# Patient Record
Sex: Female | Born: 1965 | Race: White | Hispanic: No | Marital: Married | Smoking: Former smoker
Health system: Southern US, Community
[De-identification: ages and names within clinical notes are randomized; demographics above are authoritative.]

## PROBLEM LIST (undated history)

## (undated) DIAGNOSIS — F419 Anxiety disorder, unspecified: Secondary | ICD-10-CM

## (undated) DIAGNOSIS — C801 Malignant (primary) neoplasm, unspecified: Secondary | ICD-10-CM

## (undated) DIAGNOSIS — F32A Depression, unspecified: Secondary | ICD-10-CM

## (undated) DIAGNOSIS — R51 Headache: Secondary | ICD-10-CM

## (undated) DIAGNOSIS — R519 Headache, unspecified: Secondary | ICD-10-CM

## (undated) DIAGNOSIS — F329 Major depressive disorder, single episode, unspecified: Secondary | ICD-10-CM

## (undated) DIAGNOSIS — M1990 Unspecified osteoarthritis, unspecified site: Secondary | ICD-10-CM

## (undated) HISTORY — PX: ABDOMINAL HYSTERECTOMY: SHX81

---

## 1989-12-22 HISTORY — PX: TUBAL LIGATION: SHX77

## 2001-12-22 DIAGNOSIS — C801 Malignant (primary) neoplasm, unspecified: Secondary | ICD-10-CM

## 2001-12-22 HISTORY — PX: LYMPHADENECTOMY: SHX15

## 2001-12-22 HISTORY — DX: Malignant (primary) neoplasm, unspecified: C80.1

## 2005-10-31 ENCOUNTER — Ambulatory Visit: Payer: Self-pay | Admitting: Endocrinology

## 2006-09-03 ENCOUNTER — Ambulatory Visit: Payer: Self-pay | Admitting: Obstetrics and Gynecology

## 2007-01-18 ENCOUNTER — Ambulatory Visit: Payer: Self-pay | Admitting: Specialist

## 2009-03-02 ENCOUNTER — Ambulatory Visit: Payer: Self-pay | Admitting: Obstetrics and Gynecology

## 2009-03-14 ENCOUNTER — Ambulatory Visit: Payer: Self-pay | Admitting: Family Medicine

## 2009-07-27 ENCOUNTER — Ambulatory Visit: Payer: Self-pay | Admitting: General Practice

## 2010-03-06 ENCOUNTER — Ambulatory Visit: Payer: Self-pay | Admitting: Family Medicine

## 2010-10-18 ENCOUNTER — Ambulatory Visit: Payer: Self-pay | Admitting: Family Medicine

## 2011-03-18 ENCOUNTER — Ambulatory Visit: Payer: Self-pay | Admitting: Family Medicine

## 2012-04-01 ENCOUNTER — Ambulatory Visit: Payer: Self-pay | Admitting: Family Medicine

## 2012-04-12 ENCOUNTER — Ambulatory Visit: Payer: Self-pay | Admitting: Family Medicine

## 2013-04-05 ENCOUNTER — Ambulatory Visit: Payer: Self-pay | Admitting: Obstetrics and Gynecology

## 2014-05-18 ENCOUNTER — Emergency Department: Payer: Self-pay | Admitting: Emergency Medicine

## 2015-04-11 ENCOUNTER — Ambulatory Visit
Admit: 2015-04-11 | Disposition: A | Discharge status: Home / Self Care | Payer: Self-pay | Attending: Obstetrics and Gynecology | Admitting: Obstetrics and Gynecology

## 2015-10-24 ENCOUNTER — Other Ambulatory Visit: Payer: Self-pay | Admitting: Orthopedic Surgery

## 2015-10-24 DIAGNOSIS — M25512 Pain in left shoulder: Secondary | ICD-10-CM

## 2015-11-06 ENCOUNTER — Ambulatory Visit: Admission: RE | Admit: 2015-11-06 | Payer: BLUE CROSS/BLUE SHIELD | Source: Ambulatory Visit

## 2015-11-07 ENCOUNTER — Other Ambulatory Visit (HOSPITAL_COMMUNITY): Payer: Self-pay | Admitting: Orthopedic Surgery

## 2015-11-07 DIAGNOSIS — M25512 Pain in left shoulder: Secondary | ICD-10-CM

## 2015-11-19 ENCOUNTER — Ambulatory Visit (HOSPITAL_COMMUNITY): Admission: RE | Admit: 2015-11-19 | Payer: BLUE CROSS/BLUE SHIELD | Source: Ambulatory Visit

## 2015-11-22 ENCOUNTER — Ambulatory Visit
Admission: RE | Admit: 2015-11-22 | Discharge: 2015-11-22 | Disposition: A | Discharge status: Home / Self Care | Payer: BLUE CROSS/BLUE SHIELD | Source: Ambulatory Visit | Attending: Orthopedic Surgery | Admitting: Orthopedic Surgery

## 2015-11-22 DIAGNOSIS — M25412 Effusion, left shoulder: Secondary | ICD-10-CM | POA: Insufficient documentation

## 2015-11-22 DIAGNOSIS — M25512 Pain in left shoulder: Secondary | ICD-10-CM | POA: Diagnosis not present

## 2015-11-22 DIAGNOSIS — M67814 Other specified disorders of tendon, left shoulder: Secondary | ICD-10-CM | POA: Insufficient documentation

## 2015-11-28 ENCOUNTER — Other Ambulatory Visit: Payer: Self-pay | Admitting: Orthopedic Surgery

## 2015-11-28 DIAGNOSIS — M25512 Pain in left shoulder: Secondary | ICD-10-CM

## 2015-12-06 ENCOUNTER — Ambulatory Visit
Admission: RE | Admit: 2015-12-06 | Discharge: 2015-12-06 | Disposition: A | Discharge status: Home / Self Care | Payer: BLUE CROSS/BLUE SHIELD | Source: Ambulatory Visit | Attending: Orthopedic Surgery | Admitting: Orthopedic Surgery

## 2015-12-06 DIAGNOSIS — M25512 Pain in left shoulder: Secondary | ICD-10-CM | POA: Diagnosis present

## 2015-12-06 HISTORY — DX: Headache: R51

## 2015-12-06 HISTORY — DX: Headache, unspecified: R51.9

## 2015-12-06 HISTORY — DX: Malignant (primary) neoplasm, unspecified: C80.1

## 2015-12-06 HISTORY — DX: Anxiety disorder, unspecified: F41.9

## 2015-12-06 HISTORY — DX: Major depressive disorder, single episode, unspecified: F32.9

## 2015-12-06 HISTORY — DX: Depression, unspecified: F32.A

## 2015-12-06 HISTORY — DX: Unspecified osteoarthritis, unspecified site: M19.90

## 2015-12-06 MED ORDER — METHYLPREDNISOLONE ACETATE 40 MG/ML IJ SUSP
80.0000 mg | Freq: Once | INTRAMUSCULAR | Status: DC
  Filled 2015-12-06: qty 2

## 2015-12-06 MED ORDER — IOHEXOL 180 MG/ML  SOLN: 5.0000 mL | Freq: Once | INTRAMUSCULAR | Status: DC | PRN

## 2015-12-06 MED ORDER — BUPIVACAINE HCL 0.25 % IJ SOLN
5.0000 mL | Freq: Once | INTRAMUSCULAR | Status: DC
  Filled 2015-12-06: qty 5

## 2015-12-06 NOTE — Discharge Instructions (Signed)
May take shower starting tomorrow. If you develop life threatening symptoms call 911 or go to nearest emergency room. Frequent rotation of left should will facilitate distribution of medication throughout shoulder joint. I fyou have any questions or concerns please call ordering physician.

## 2016-11-04 ENCOUNTER — Ambulatory Visit: Payer: Self-pay | Admitting: Urology

## 2016-11-20 ENCOUNTER — Encounter: Payer: Self-pay | Admitting: Urology

## 2016-11-20 ENCOUNTER — Ambulatory Visit: Payer: BLUE CROSS/BLUE SHIELD | Admitting: Urology

## 2016-11-20 VITALS — BP 106/73 | HR 80 | Ht 63.0 in | Wt 127.3 lb

## 2016-11-20 DIAGNOSIS — N390 Urinary tract infection, site not specified: Secondary | ICD-10-CM

## 2016-11-20 MED ORDER — CEPHALEXIN 250 MG PO CAPS: ORAL_CAPSULE | ORAL | 2 refills | Status: DC

## 2016-11-20 NOTE — Patient Instructions (Signed)

## 2016-11-20 NOTE — Progress Notes (Signed)
11/20/2016 3:36 PM   Melanie Sparks 11/07/1966 [id number]  Referring provider: Serita Butcher, [place], [place] [address]  Chief Complaint  Patient presents with  . New Patient (Initial Visit)    recurrent uti referred by Sallyanne Havers FNP    HPI: Patient is a 50 -year-old Caucasian female who is referred to [place] by, Sallyanne Havers, NP, for recurrent urinary tract infections.  Patient states that she has had 2 to 3 urinary tract infections over the last year.  Her symptoms with a urinary tract infection consist of fatigue, frequency and urgency.  She denies dysuria, gross hematuria, suprapubic pain, back pain, abdominal pain or flank pain.  She has not had any recent fevers, chills, nausea or vomiting.   She does not have a history of nephrolithiasis, GU surgery or GU trauma.   Reviewing her records,  she has had no documented UTI's.    She is sexually active.  She has not noted a correlation with her urinary tract infections and sexual intercourse.    She is post menopausal.  She is wearing a Femring.   She does not engage in anal sex.    She is  voiding before and after sex.   She admits to constipation and/or diarrhea.    She does engage in good perineal hygiene. She does take tub baths.   She does not have incontinence.    She is not having pain with bladder filling.    She has not had any recent imaging studies.    She is drinking 2 to 3 glasses of water daily.  4 to 5 cups of coffee daily.  One to two glasses of tea daily.    PMH: Past Medical History:  Diagnosis Date  . Anxiety   . Arthritis    bursitis left shoulder  . Cancer ([place]) 2003   cerrvical  . Depression   . Headache     Surgical History: Past Surgical History:  Procedure Laterality Date  . ABDOMINAL HYSTERECTOMY    . CESAREAN SECTION  1986  . LYMPHADENECTOMY  2003  . TUBAL LIGATION  1991    Home Medications:    Medication List       Accurate as of 11/20/16  3:36 PM.  Always use your most recent med list.          acetaminophen 500 MG tablet Commonly known as:  TYLENOL Take 500 mg by mouth every 6 (six) hours as needed.   amphetamine-dextroamphetamine 10 MG tablet Commonly known as:  ADDERALL Take by mouth.   aspirin-acetaminophen-caffeine 250-250-65 MG tablet Commonly known as:  EXCEDRIN MIGRAINE Take 2 tablets by mouth every 6 (six) hours as needed for headache.   azelastine 0.05 % ophthalmic solution Commonly known as:  OPTIVAR 1 drop 2 (two) times daily.   cephALEXin 250 MG capsule Commonly known as:  KEFLEX Take one tablet after intercourse   FEMRING 0.1 MG/24HR Ring Generic drug:  Estradiol Acetate Place vaginally.   methylPREDNISolone 4 MG tablet Commonly known as:  MEDROL Day one - 6 tab Day two - 5 tab Day three - 4 tabs Day four - 3 tabs Day five - 2 tabs Day six - 1 tab   nortriptyline 25 MG capsule Commonly known as:  PAMELOR Take 25 mg by mouth at bedtime.   PARoxetine 10 MG tablet Commonly known as:  PAXIL Take 10 mg by mouth daily.       Allergies:  Allergies  Allergen Reactions  . Bupropion Other (See Comments)    Flu like symptoms   . Codeine Other (See Comments)  . Hydrocodone-Acetaminophen Other (See Comments)  . Macrobid [employer] Macro]   . Oxycodone Rash    Family History: Family History  Problem Relation Age of Onset  . Kidney disease Neg Hx   . Bladder Cancer Neg Hx   . Prostate cancer Neg Hx     Social History:  reports that she quit smoking about 2 years ago. She has a 35.00 pack-year smoking history. She has never used smokeless tobacco. She reports that she does not drink alcohol or use drugs.  ROS: UROLOGY Frequent Urination?: Yes Hard to postpone urination?: Yes Burning/pain with urination?: No Get up at night to urinate?: Yes Leakage of urine?: No Urine stream starts and stops?: No Trouble starting stream?: No Do you have to strain to urinate?: No Blood in  urine?: Yes Urinary tract infection?: Yes Sexually transmitted disease?: No Injury to kidneys or bladder?: No Painful intercourse?: No Weak stream?: No Currently pregnant?: No Vaginal bleeding?: No Last menstrual period?: n  Gastrointestinal Nausea?: No Vomiting?: No Indigestion/heartburn?: No Diarrhea?: No Constipation?: No  Constitutional Fever: No Night sweats?: Yes Weight loss?: No Fatigue?: Yes  Skin Skin rash/lesions?: No Itching?: No  Eyes Blurred vision?: No Double vision?: No  Ears/Nose/Throat Sore throat?: No Sinus problems?: No  Hematologic/Lymphatic Swollen glands?: No Easy bruising?: No  Cardiovascular Leg swelling?: No Chest pain?: No  Respiratory Cough?: No Shortness of breath?: No  Endocrine Excessive thirst?: No  Musculoskeletal Back pain?: Yes Joint pain?: Yes  Neurological Headaches?: Yes Dizziness?: No  Psychologic Depression?: No Anxiety?: No  Physical Exam: BP 106/73   Pulse 80   Ht 5\' 3"  (1.6 m)   Wt 127 lb 4.8 oz (57.7 kg)   BMI 22.55 kg/m   Constitutional: Well nourished. Alert and oriented, No acute distress. HEENT: [place] AT, moist mucus membranes. Trachea midline, no masses. Cardiovascular: No clubbing, cyanosis, or edema. Respiratory: Normal respiratory effort, no increased work of breathing. GI: Abdomen is soft, non tender, non distended, no abdominal masses. Liver and spleen not palpable.  No hernias appreciated.  Stool sample for occult testing is not indicated.   GU: No CVA tenderness.  No bladder fullness or masses.   Skin: No rashes, bruises or suspicious lesions. Lymph: No cervical or inguinal adenopathy. Neurologic: Grossly intact, no focal deficits, moving all 4 extremities. Psychiatric: Normal mood and affect.   Assessment & Plan:    1. Recurrent UTI  - Patient is instructed to increase her water intake until the urine is pale yellow or clear.  I have advised her to take probiotics (yogurt, oral  pills or vaginal suppositories), take cranberry pills or drink the juice and use the estrogen cream.  She is to take Vitamin C 1,000 mg daily to acidify the urine.   - Since she has noted a correlation with sexual activity, so I will prescribe a post-coital antibiotic, Keflex  - Because of her history of recurrent UTIs, I have asked the patient to contact our office if she should experience symptoms of urinary tract infection so that we can obtain an urine specimen for urinalysis and culture.                                  Return in about 3 months (around 02/18/2017) for symptom recheck.  These notes generated with  voice recognition software. I apologize for typographical errors.  Zara Council, [facility] [address], [place], [place] [address] [phone]

## 2017-02-19 ENCOUNTER — Ambulatory Visit: Payer: BLUE CROSS/BLUE SHIELD | Admitting: Urology

## 2017-02-25 ENCOUNTER — Other Ambulatory Visit: Payer: Self-pay

## 2017-03-11 NOTE — Progress Notes (Signed)
03/12/2017 3:26 PM   Melanie Sparks 02/12/1966 [id number]  Referring provider: Serita Butcher, [place], [place] [address]  Chief Complaint  Patient presents with  . Recurrent UTI    3 month follow up    HPI: 51 yo WF with a history of recurrent urinary tract infections who was placed on postcoital antibiotic presents today for 3 month follow-up  Background history Patient is a 65 -year-old Caucasian female who is referred to [place] by, Sallyanne Havers, NP, for recurrent urinary tract infections.  Patient states that she has had 2 to 3 urinary tract infections over the last year.  Her symptoms with a urinary tract infection consist of fatigue, frequency and urgency.  She denies dysuria, gross hematuria, suprapubic pain, back pain, abdominal pain or flank pain.  She has not had any recent fevers, chills, nausea or vomiting.  She does not have a history of nephrolithiasis, GU surgery or GU trauma.  Reviewing her records,  she has had no documented UTI's.  She is sexually active.  She has noted a correlation with her urinary tract infections and sexual intercourse.  She is post menopausal.  She is wearing a Femring.   She does not engage in anal sex.    She is  voiding before and after sex.  She admits to constipation and/or diarrhea.    She does engage in good perineal hygiene. She does take tub baths.   She does not have incontinence.   She is not having pain with bladder filling.  She has not had any recent imaging studies.  She is drinking 2 to 3 glasses of water daily.  4 to 5 cups of coffee daily.  One to two glasses of tea daily.    Today, she is experiencing urgency x 0-3, frequency x 4-7, incontinence x 0-3 and nocturia x 0-3.  She is not having dysuria, gross hematuria and suprapubic.  She has not had fevers, chills, nausea or vomiting.    PMH: Past Medical History:  Diagnosis Date  . Anxiety   . Arthritis    bursitis left shoulder  . Cancer ([place]) 2003   cerrvical  . Depression   . Headache     Surgical History: Past Surgical History:  Procedure Laterality Date  . ABDOMINAL HYSTERECTOMY    . CESAREAN SECTION  1986  . LYMPHADENECTOMY  2003  . TUBAL LIGATION  1991    Home Medications:  Allergies as of 03/12/2017      Reactions   Bupropion Other (See Comments)   Flu like symptoms   Codeine Other (See Comments)   Hydrocodone-acetaminophen Other (See Comments)   Macrobid [nitrofurantoin Monohyd Macro]    Oxycodone Rash      Medication List       Accurate as of 03/12/17  3:26 PM. Always use your most recent med list.          acetaminophen 500 MG tablet Commonly known as:  TYLENOL Take 500 mg by mouth every 6 (six) hours as needed.   amphetamine-dextroamphetamine 10 MG tablet Commonly known as:  ADDERALL Take by mouth.   aspirin-acetaminophen-caffeine 250-250-65 MG tablet Commonly known as:  EXCEDRIN MIGRAINE Take 2 tablets by mouth every 6 (six) hours as needed for headache.   azelastine 0.05 % ophthalmic solution Commonly known as:  OPTIVAR 1 drop 2 (two) times daily.   cephALEXin 250 MG capsule Commonly known as:  KEFLEX Take one tablet after intercourse   FEMRING 0.1  MG/24HR Ring Generic drug:  Estradiol Acetate Place vaginally.   methylPREDNISolone 4 MG tablet Commonly known as:  MEDROL Day one - 6 tab Day two - 5 tab Day three - 4 tabs Day four - 3 tabs Day five - 2 tabs Day six - 1 tab   nortriptyline 25 MG capsule Commonly known as:  PAMELOR Take 25 mg by mouth at bedtime.   PARoxetine 10 MG tablet Commonly known as:  PAXIL Take 10 mg by mouth daily.   Prasterone 6.5 MG Inst Commonly known as:  INTRAROSA Place 1 suppository vaginally daily.       Allergies:  Allergies  Allergen Reactions  . Bupropion Other (See Comments)    Flu like symptoms   . Codeine Other (See Comments)  . Hydrocodone-Acetaminophen Other (See Comments)  . Macrobid [employer] Macro]   . Oxycodone  Rash    Family History: Family History  Problem Relation Age of Onset  . Kidney disease Neg Hx   . Bladder Cancer Neg Hx   . Prostate cancer Neg Hx   . Kidney cancer Neg Hx     Social History:  reports that she quit smoking about 3 years ago. She has a 35.00 pack-year smoking history. She has never used smokeless tobacco. She reports that she does not drink alcohol or use drugs.  ROS: UROLOGY Frequent Urination?: No Hard to postpone urination?: No Burning/pain with urination?: No Get up at night to urinate?: No Leakage of urine?: No Urine stream starts and stops?: No Trouble starting stream?: No Do you have to strain to urinate?: No Blood in urine?: No Urinary tract infection?: No Sexually transmitted disease?: No Injury to kidneys or bladder?: No Painful intercourse?: Yes Weak stream?: No Currently pregnant?: No Vaginal bleeding?: No Last menstrual period?: n  Gastrointestinal Nausea?: No Vomiting?: No Indigestion/heartburn?: No Diarrhea?: No Constipation?: No  Constitutional Fever: No Night sweats?: Yes Weight loss?: No Fatigue?: Yes  Skin Skin rash/lesions?: No Itching?: No  Eyes Blurred vision?: Yes Double vision?: No  Ears/Nose/Throat Sore throat?: No Sinus problems?: No  Hematologic/Lymphatic Swollen glands?: No Easy bruising?: No  Cardiovascular Leg swelling?: No Chest pain?: No  Respiratory Cough?: No Shortness of breath?: No  Endocrine Excessive thirst?: No  Musculoskeletal Back pain?: No Joint pain?: Yes  Neurological Headaches?: No Dizziness?: No  Psychologic Depression?: No Anxiety?: No  Physical Exam: BP (!) 93/59   Pulse 91   Ht 5\' 3"  (1.6 m)   Wt 119 lb 9.6 oz (54.3 kg)   BMI 21.19 kg/m   Constitutional: Well nourished. Alert and oriented, No acute distress. HEENT: [place] AT, moist mucus membranes. Trachea midline, no masses. Cardiovascular: No clubbing, cyanosis, or edema. Respiratory: Normal respiratory  effort, no increased work of breathing. GI: Abdomen is soft, non tender, non distended, no abdominal masses. Liver and spleen not palpable.  No hernias appreciated.  Stool sample for occult testing is not indicated.   GU: No CVA tenderness.  No bladder fullness or masses.   Skin: No rashes, bruises or suspicious lesions. Lymph: No cervical or inguinal adenopathy. Neurologic: Grossly intact, no focal deficits, moving all 4 extremities. Psychiatric: Normal mood and affect.   Assessment & Plan:    1. Recurrent UTI  - Patient is instructed to increase her water intake until the urine is pale yellow or clear.  I have advised her to take probiotics (yogurt, oral pills or vaginal suppositories), take cranberry pills or drink the juice and use the estrogen cream.  She  is to take Vitamin C 1,000 mg daily to acidify the urine.   - Since she has noted a correlation with sexual activity, so I will prescribe a post-coital antibiotic, Keflex  - Because of her history of recurrent UTIs, I have asked the patient to contact our office if she should experience symptoms of urinary tract infection so that we can obtain an urine specimen for urinalysis and culture.   - OAB questionnaire   2. Dyspareunia  - explained to the patient that her menopausal state causes her vaginal tissue to become dry, inflamed and thin and this is most likely the cause of her dyspareunia             - will start Intrarosa suppositories to insert vaginally nightly - given samples             - RTC in 3 months for exam and symptom recheck                                 Return in about 3 months (around 06/12/2017) for OAB questionnaire and exam.  These notes generated with voice recognition software. I apologize for typographical errors.  Zara Council, [facility] [address] [place], [place] [address] [phone]

## 2017-03-12 ENCOUNTER — Ambulatory Visit: Payer: BLUE CROSS/BLUE SHIELD | Admitting: Urology

## 2017-03-12 ENCOUNTER — Encounter: Payer: Self-pay | Admitting: Urology

## 2017-03-12 VITALS — BP 93/59 | HR 91 | Ht 63.0 in | Wt 119.6 lb

## 2017-03-12 DIAGNOSIS — N941 Unspecified dyspareunia: Secondary | ICD-10-CM | POA: Diagnosis not present

## 2017-03-12 DIAGNOSIS — N390 Urinary tract infection, site not specified: Secondary | ICD-10-CM

## 2017-03-12 MED ORDER — PRASTERONE 6.5 MG VA INST: 1.0000 | VAGINAL_INSERT | Freq: Every day | VAGINAL | 12 refills | Status: AC

## 2017-05-04 ENCOUNTER — Emergency Department
Admission: EM | Admit: 2017-05-04 | Discharge: 2017-05-04 | Disposition: A | Discharge status: Home / Self Care | Payer: BLUE CROSS/BLUE SHIELD | Attending: Emergency Medicine | Admitting: Emergency Medicine

## 2017-05-04 ENCOUNTER — Emergency Department: Payer: BLUE CROSS/BLUE SHIELD

## 2017-05-04 ENCOUNTER — Encounter: Payer: Self-pay | Admitting: Emergency Medicine

## 2017-05-04 ENCOUNTER — Telehealth: Payer: Self-pay | Admitting: Cardiovascular Disease

## 2017-05-04 DIAGNOSIS — F17200 Nicotine dependence, unspecified, uncomplicated: Secondary | ICD-10-CM | POA: Insufficient documentation

## 2017-05-04 DIAGNOSIS — Z8541 Personal history of malignant neoplasm of cervix uteri: Secondary | ICD-10-CM | POA: Diagnosis not present

## 2017-05-04 DIAGNOSIS — R079 Chest pain, unspecified: Secondary | ICD-10-CM | POA: Insufficient documentation

## 2017-05-04 LAB — CBC
HEMATOCRIT: 36 % (ref 35.0–47.0)
Hemoglobin: 12.2 g/dL (ref 12.0–16.0)
MCH: 30.5 pg (ref 26.0–34.0)
MCHC: 33.8 g/dL (ref 32.0–36.0)
MCV: 90.1 fL (ref 80.0–100.0)
PLATELETS: 242 10*3/uL (ref 150–440)
RBC: 4 MIL/uL (ref 3.80–5.20)
RDW: 14 % (ref 11.5–14.5)
WBC: 4.4 10*3/uL (ref 3.6–11.0)

## 2017-05-04 LAB — COMPREHENSIVE METABOLIC PANEL
ALBUMIN: 4.3 g/dL (ref 3.5–5.0)
ALT: 11 U/L — ABNORMAL LOW (ref 14–54)
ANION GAP: 5 (ref 5–15)
AST: 19 U/L (ref 15–41)
Alkaline Phosphatase: 49 U/L (ref 38–126)
BILIRUBIN TOTAL: 0.7 mg/dL (ref 0.3–1.2)
BUN: 9 mg/dL (ref 6–20)
CALCIUM: 9.4 mg/dL (ref 8.9–10.3)
CO2: 28 mmol/L (ref 22–32)
CREATININE: 0.74 mg/dL (ref 0.44–1.00)
Chloride: 107 mmol/L (ref 101–111)
GFR calc Af Amer: >60 mL/min (ref >60)
GLUCOSE: 97 mg/dL (ref 65–99)
POTASSIUM: 4.3 mmol/L (ref 3.5–5.1)
Sodium: 140 mmol/L (ref 135–145)
TOTAL PROTEIN: 7.3 g/dL (ref 6.5–8.1)

## 2017-05-04 LAB — LIPASE, BLOOD: Lipase: 25 U/L (ref 11–51)

## 2017-05-04 LAB — TROPONIN I: Troponin I: <0.03 ng/mL (ref <0.03)

## 2017-05-04 NOTE — ED Triage Notes (Signed)
Intermittent chest pain x 3 weeks. Denies fevers. Points to mid chest as site of pain now however states it changes in location. At times accompanied by SOB. Denies injury.

## 2017-05-04 NOTE — Telephone Encounter (Signed)
Patient seen at [facility] ED for chest pains on 05/04/17. Patient advised to be seen by Dr. Rockey Situ as soon as possible. New pt appt scheduled for 06/26/17. Please advise.

## 2017-05-04 NOTE — Telephone Encounter (Signed)
You don't have any New Pt slots -do you want to add her on to a "same day" slot?

## 2017-05-04 NOTE — ED Provider Notes (Signed)
[facility] Emergency Department Provider Note  ____________________________________________   First MD Initiated Contact with Patient 05/04/17 1448     (approximate)  I have reviewed the triage vital signs and the nursing notes.   HISTORY  Chief Complaint Chest Pain   HPI Melanie Sparks is a 51 y.o. female with a history of anxiety as well as depression who is presenting to the emergency department today with 3 weeks of chest pain. Says that the chest pain can be on either side of her chest. She says that she is pain-free at this time. Says that it does not come on with exertion and may come on at any point during the day. She says that she does get short of breath with the pain. Says the pain feels like a squeezing sensation. Denies any radiation of the back or to the neck. Denies any nausea or vomiting. Denies any history of coronary artery disease. Denies any family history of coronary artery disease. Says that she does not use any drugs. However, she does "great." Says that she also has a remote smoking history. Patient said that she came to the emergency department today after speaking with her nurse practitioner who recommended that she be seen in the emergency department.   Past Medical History:  Diagnosis Date  . Anxiety   . Arthritis    bursitis left shoulder  . Cancer ([place]) 2003   cerrvical  . Depression   . Headache     There are no active problems to display for this patient.   Past Surgical History:  Procedure Laterality Date  . ABDOMINAL HYSTERECTOMY    . CESAREAN SECTION  1986  . LYMPHADENECTOMY  2003  . TUBAL LIGATION  1991    Prior to Admission medications   Medication Sig Start Date End Date Taking? Authorizing Provider  acetaminophen (TYLENOL) 500 MG tablet Take 500 mg by mouth every 6 (six) hours as needed.   Yes [provider]  aspirin-acetaminophen-caffeine (EXCEDRIN MIGRAINE) [phone] MG tablet Take 2 tablets  by mouth every 6 (six) hours as needed for headache.   Yes [provider]  amphetamine-dextroamphetamine (ADDERALL) 10 MG tablet Take by mouth.    [provider]  azelastine (OPTIVAR) 0.05 % ophthalmic solution 1 drop 2 (two) times daily.    [provider]  cephALEXin (KEFLEX) 250 MG capsule Take one tablet after intercourse Patient not taking: Reported on 03/12/2017 11/20/16   Zara Council A, PA-C  Estradiol Acetate [facility]) 0.1 MG/24HR RING Place vaginally.    [provider]  methylPREDNISolone (MEDROL) 4 MG tablet Day one - 6 tab Day two - 5 tab Day three - 4 tabs Day four - 3 tabs Day five - 2 tabs Day six - 1 tab 06/02/14   [provider]  nortriptyline (PAMELOR) 25 MG capsule Take 25 mg by mouth at bedtime.    [provider]  PARoxetine (PAXIL) 10 MG tablet Take 10 mg by mouth daily.    [provider]  Prasterone (INTRAROSA) 6.5 MG INST Place 1 suppository vaginally daily. 03/12/17   Zara Council A, PA-C    Allergies Bupropion; Codeine; Hydrocodone-acetaminophen; Macrobid [nitrofurantoin monohyd macro]; and Oxycodone  Family History  Problem Relation Age of Onset  . Kidney disease Neg Hx   . Bladder Cancer Neg Hx   . Prostate cancer Neg Hx   . Kidney cancer Neg Hx     Social History Social History  Substance Use Topics  .  Smoking status: Current Every Day Smoker    Packs/day: 0.00    Years: 35.00    Last attempt to quit: 12/22/2013  . Smokeless tobacco: Current User     Comment: VAPE  . Alcohol use No    Review of Systems  Constitutional: No fever/chills Eyes: No visual changes. ENT: No sore throat. Cardiovascular: as above Respiratory: as above Gastrointestinal: No abdominal pain.  No nausea, no vomiting.  No diarrhea.  No constipation. Genitourinary: Negative for dysuria. Musculoskeletal: Negative for back pain. Skin: Negative for rash. Neurological: Negative for headaches, focal weakness  or numbness.   ____________________________________________   PHYSICAL EXAM:  VITAL SIGNS: ED Triage Vitals [05/04/17 1028]  Enc Vitals Group     BP 122/63     Pulse Rate 80     Resp 20     Temp 98 F (36.7 C)     Temp Source Oral     SpO2 100 %     Weight 115 lb (52.2 kg)     Height 5\' 3"  (1.6 m)     Head Circumference      Peak Flow      Pain Score 1     Pain Loc      Pain Edu?      Excl. in [place]?     Constitutional: Alert and oriented. Well appearing and in no acute distress. Eyes: Conjunctivae are normal. PERRL. EOMI. Head: Atraumatic. Nose: No congestion/rhinnorhea. Mouth/Throat: Mucous membranes are moist.   Neck: No stridor.   Cardiovascular: Normal rate, regular rhythm. Grossly normal heart sounds. Equal and bilateral radial pulses are present. Says that there is a mild pressure sensation when I push on the anterior the chest. Respiratory: Normal respiratory effort.  No retractions. Lungs CTAB. Gastrointestinal: Soft and nontender. No distention.  Musculoskeletal: No lower extremity tenderness nor edema.  No joint effusions. Neurologic:  Normal speech and language. No gross focal neurologic deficits are appreciated.  Skin:  Skin is warm, dry and intact. No rash noted. Psychiatric: Mood and affect are normal. Speech and behavior are normal.  ____________________________________________   LABS (all labs ordered are listed, but only abnormal results are displayed)  Labs Reviewed  COMPREHENSIVE METABOLIC PANEL - Abnormal; Notable for the following:       Result Value   ALT 11 (*)    All other components within normal limits  CBC  TROPONIN I  LIPASE, BLOOD   ____________________________________________  EKG  ED ECG REPORT I, Schaevitz,  Youlanda Roys, the attending physician, personally viewed and interpreted this ECG.   Date: 05/04/2017  EKG Time: 1037  Rate: 83  Rhythm: normal sinus rhythm  Axis: normal  Intervals:none  ST&T Change: No ST segment  elevation or depression. Single T-wave inversion in aVL. No previous for comparison. ____________________________________________  WFUXNATFT  DG Chest 2 View (Final result)  Result time 05/04/17 11:04:26  Final result by Abigail Miyamoto, MD (05/04/17 11:04:26)           Narrative:   CLINICAL DATA: Patient reports chest pain and cough for last three weeks. History of smoking but she quit a few years ago. She now only vapes.  EXAM: CHEST 2 VIEW  COMPARISON: None.  FINDINGS: Midline trachea. Normal heart size and mediastinal contours. No pleural effusion or pneumothorax. Biapical pleural-parenchymal scarring. Diffuse peribronchial thickening. Lungs otherwise clear.  IMPRESSION: 1. No acute cardiopulmonary disease. 2. Mild peribronchial thickening which may relate to chronic bronchitis or smoking.   Electronically Signed By: Adria Devon.D.  On: 05/04/2017 11:04          ____________________________________________   PROCEDURES  Procedure(s) performed:   Procedures  Critical Care performed:   ____________________________________________   INITIAL IMPRESSION / ASSESSMENT AND PLAN / ED COURSE  Pertinent labs & imaging results that were available during my care of the patient were reviewed by me and considered in my medical decision making (see chart for details).  Patient pain-free in the emergency department. Heart score of 3. We discussed her very reassuring workup and the need to follow-up in the office with cardiology within the next 72 hours. The patient will be calling cardiology this afternoon upon discharge to schedule this appointment. She is understanding of the plan and willing to comply. Unclear cause of the patient's pain. However, her description of the pain would be very atypical for ACS. Also very likely to be aortic catastrophe or PE.      ____________________________________________   FINAL CLINICAL IMPRESSION(S) / ED  DIAGNOSES  Chest pain.    NEW MEDICATIONS STARTED DURING THIS VISIT:  New Prescriptions   No medications on file     Note:  This document was prepared using Dragon voice recognition software and may include unintentional dictation errors.    Orbie Pyo, MD 05/04/17 [phone]

## 2017-05-07 NOTE — Telephone Encounter (Signed)
Left detailed voicemail message per release form that we have some options for appointment next week and to call back.

## 2017-05-07 NOTE — Telephone Encounter (Signed)
Perhaps we could book her at 3:00 on May 23 or 24th

## 2017-05-08 NOTE — Telephone Encounter (Signed)
Left message for pt to call back  °

## 2017-05-12 NOTE — Progress Notes (Signed)
Cardiology Office Note  Date:  05/13/2017   ID:  Melanie Sparks, DOB 1966-06-08, MRN [id number]  PCP:  Elgie Collard, MD   Chief Complaint  Patient presents with  . OTHER    F/u hospital due to chest pain. Meds reviewed verbally with pt.    HPI:  51 year old woman with history of anxiety,  Depression Prior smoking history, quit 3 to 4 yrs , started when she was a teenager Who presented to the emergency room 05/04/2017 with 3 weeks of chest pain She presents by referral from Dr. 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  Denies any family history of coronary artery disease.  Says that she does not use any drugs.   She reports that she had 3 episodes today Presented At rest Lasting seconds at a time Blood pressure low on today's visit, no orthostasis symptoms. Reports blood pressure typically runs low   EKG personally reviewed by myself on todays visit Normal sinus rhythm with rate 62 bpm no significant ST or T-wave changes   PMH:   has a past medical history of Anxiety; Arthritis; Cancer [facility]) (2003); Depression; and Headache.  PSH:    Past Surgical History:  Procedure Laterality Date  . ABDOMINAL HYSTERECTOMY    . CESAREAN SECTION  1986  . LYMPHADENECTOMY  2003  . TUBAL  LIGATION  1991    Current Outpatient Prescriptions  Medication Sig Dispense Refill  . amoxicillin (AMOXIL) 500 MG tablet Take 500 mg by mouth 3 (three) times daily.    Marland Kitchen aspirin EC 81 MG tablet Take 81 mg by mouth as needed.    Marland Kitchen aspirin-acetaminophen-caffeine (EXCEDRIN MIGRAINE) 250-250-65 MG tablet Take 2 tablets by mouth every 6 (six) hours as needed for headache.    . Estradiol Acetate (FEMRING) 0.1 MG/24HR RING Place vaginally.    Marland Kitchen loratadine (CLARITIN) 10 MG tablet Take 10 mg by mouth daily.    . Prasterone (INTRAROSA) 6.5 MG INST Place 1 suppository vaginally daily. 30 each 12  . ranitidine (ZANTAC) 150 MG tablet Take 150 mg by mouth 2 (two) times daily.     No current facility-administered medications for this visit.      Allergies:   Bupropion; Codeine; Hydrocodone-acetaminophen; Macrobid [nitrofurantoin monohyd macro]; and Oxycodone   Social History:  The patient  reports that she has been smoking.  She has been smoking about 0.00 packs per day for the past 35.00 years. She has never used smokeless tobacco. She reports that she drinks alcohol. She reports that she does not use drugs.   Family History:   family history is not on file.    Review of Systems: Review of Systems  Constitutional: Negative.   Respiratory: Negative.   Cardiovascular: Positive for chest pain.  Gastrointestinal: Negative.   Musculoskeletal: Negative.   Neurological: Negative.   Psychiatric/Behavioral: Negative.   All  other systems reviewed and are negative.    PHYSICAL EXAM: VS:  BP 92/62 (BP Location: Left Arm, Patient Position: Sitting, Cuff Size: Normal)   Pulse 62   Ht 5\' 3"  (1.6 m)   Wt 118 lb 4 oz (53.6 kg)   BMI 20.95 kg/m  , BMI Body mass index is 20.95 kg/m. GEN: Well nourished, well developed, in no acute distress  HEENT: normal  Neck: no JVD, carotid bruits, or masses Cardiac: RRR; no murmurs, rubs, or gallops,no edema  Respiratory:  clear to auscultation bilaterally,  normal work of breathing GI: soft, nontender, nondistended, + BS MS: no deformity or atrophy  Skin: warm and dry, no rash Neuro:  Strength and sensation are intact Psych: euthymic mood, full affect    Recent Labs: 05/04/2017: ALT 11; BUN 9; Creatinine, Ser 0.74; Hemoglobin 12.2; Platelets 242; Potassium 4.3; Sodium 140    Lipid Panel No results found for: CHOL, HDL, LDLCALC, TRIG    Wt Readings from Last 3 Encounters:  05/13/17 118 lb 4 oz (53.6 kg)  05/04/17 115 lb (52.2 kg)  03/12/17 119 lb 9.6 oz (54.3 kg)       ASSESSMENT AND PLAN:  Chest pain, unspecified type - Plan: EKG 12-Lead Atypical symptoms Long discussion with her, symptoms present at rest otherwise very active at baseline Smoking history otherwise no diabetes and no history of hyperlipidemia Long discussion concerning various treatment options for her chest pain Option #1 would be medical management and close monitoring for worsening symptoms Second option would be CT coronary calcium scoring Last option would be some kind of stress testing particular symptoms get worse She would like option #1, will call our office if she has worsening symptoms. She feels better that most of her symptoms have improved over the past month atypical in nature, She will call us if she would like additional testing    Smoking history  she stopped smoking several years ago, long smoking history since she was a teenager   Anxiety and depression  managed by primary care   Hypotension, unspecified hypotension type  recommended he avoid excessive weight loss given systolic pressure in the 09W   she reports this is close to her baseline  Disposition:   F/U As needed Patient was seen in consultation for chest pain and will be referred back to primary care for ongoing care of the issues detailed above   Orders Placed This Encounter  Procedures  . EKG 12-Lead     Signed, Esmond Plants, M.D., Ph.D. 05/13/2017  [place], [place]

## 2017-05-13 ENCOUNTER — Encounter: Payer: Self-pay | Admitting: Cardiovascular Disease

## 2017-05-13 ENCOUNTER — Ambulatory Visit (INDEPENDENT_AMBULATORY_CARE_PROVIDER_SITE_OTHER): Payer: BLUE CROSS/BLUE SHIELD | Admitting: Cardiovascular Disease

## 2017-05-13 VITALS — BP 92/62 | HR 62 | Ht 63.0 in | Wt 118.2 lb

## 2017-05-13 DIAGNOSIS — F32A Depression, unspecified: Secondary | ICD-10-CM | POA: Insufficient documentation

## 2017-05-13 DIAGNOSIS — Z87891 Personal history of nicotine dependence: Secondary | ICD-10-CM | POA: Insufficient documentation

## 2017-05-13 DIAGNOSIS — R079 Chest pain, unspecified: Secondary | ICD-10-CM | POA: Diagnosis not present

## 2017-05-13 DIAGNOSIS — I959 Hypotension, unspecified: Secondary | ICD-10-CM | POA: Diagnosis not present

## 2017-05-13 DIAGNOSIS — F329 Major depressive disorder, single episode, unspecified: Secondary | ICD-10-CM | POA: Diagnosis not present

## 2017-05-13 DIAGNOSIS — F419 Anxiety disorder, unspecified: Secondary | ICD-10-CM

## 2017-05-13 NOTE — Patient Instructions (Signed)
Medication Instructions:   No medication changes made  Labwork:  No new labs needed  Testing/Procedures:  No further testing at this time   I recommend watching educational videos on topics of interest to you at:       [email or web address]  Enter code: HEARTCARE    Follow-Up: It was a pleasure seeing you in the office today. Please call us if you have new issues that need to be addressed before your next appt.  [phone]  Your physician wants you to follow-up in:  As needed  If you need a refill on your cardiac medications before your next appointment, please call your pharmacy.     

## 2017-06-16 ENCOUNTER — Ambulatory Visit: Payer: BLUE CROSS/BLUE SHIELD | Admitting: Urology

## 2017-06-26 ENCOUNTER — Ambulatory Visit: Payer: BLUE CROSS/BLUE SHIELD | Admitting: Cardiovascular Disease

## 2017-07-08 ENCOUNTER — Ambulatory Visit: Payer: BLUE CROSS/BLUE SHIELD | Admitting: Urology

## 2018-04-14 ENCOUNTER — Other Ambulatory Visit: Payer: Self-pay | Admitting: Obstetrics and Gynecology

## 2018-04-14 DIAGNOSIS — Z1231 Encounter for screening mammogram for malignant neoplasm of breast: Secondary | ICD-10-CM

## 2019-11-14 ENCOUNTER — Telehealth: Payer: BLUE CROSS/BLUE SHIELD | Admitting: Physician Assistant

## 2019-11-14 DIAGNOSIS — J0110 Acute frontal sinusitis, unspecified: Secondary | ICD-10-CM | POA: Diagnosis not present

## 2019-11-14 MED ORDER — AMOXICILLIN-POT CLAVULANATE 875-125 MG PO TABS: 1.0000 | ORAL_TABLET | Freq: Two times a day (BID) | ORAL | 0 refills | Status: AC

## 2019-11-14 MED ORDER — ONDANSETRON HCL 4 MG PO TABS: 4.0000 mg | ORAL_TABLET | Freq: Three times a day (TID) | ORAL | 0 refills | Status: AC | PRN

## 2019-11-14 NOTE — Progress Notes (Signed)
We are sorry that you are not feeling well.  Here is how we plan to help!  Based on what you have shared with me it looks like you have sinusitis.  Sinusitis is inflammation and infection in the sinus cavities of the head.  Based on your presentation I believe you most likely have Acute Bacterial Sinusitis.  This is an infection caused by bacteria and is treated with antibiotics. I have prescribed Augmentin 875mg /125mg  one tablet twice daily with food, for 7 days. You may use an oral decongestant such as Mucinex D or if you have glaucoma or high blood pressure use plain Mucinex. Saline nasal spray help and can safely be used as often as needed for congestion.  If you develop worsening sinus pain, fever or notice severe headache and vision changes, or if symptoms are not better after completion of antibiotic, please schedule an appointment with a health care provider.    You will have to follow up with your regular provider to make any changes in your migraine medications. Hopefully treating your sinus infection will help with your headaches. I will also send you a prescription for zofran 4mg  tablets for nausea  Sinus infections are not as easily transmitted as other respiratory infection, however we still recommend that you avoid close contact with loved ones, especially the very young and elderly.  Remember to wash your hands thoroughly throughout the day as this is the number one way to prevent the spread of infection!  Home Care:  Only take medications as instructed by your medical team.  Complete the entire course of an antibiotic.  Do not take these medications with alcohol.  A steam or ultrasonic humidifier can help congestion.  You can place a towel over your head and breathe in the steam from hot water coming from a faucet.  Avoid close contacts especially the very young and the elderly.  Cover your mouth when you cough or sneeze.  Always remember to wash your hands.  Get Help Right  Away If:  You develop worsening fever or sinus pain.  You develop a severe head ache or visual changes.  Your symptoms persist after you have completed your treatment plan.  Make sure you  Understand these instructions.  Will watch your condition.  Will get help right away if you are not doing well or get worse.  Your e-visit answers were reviewed by a board certified advanced clinical practitioner to complete your personal care plan.  Depending on the condition, your plan could have included both over the counter or prescription medications.  If there is a problem please reply  once you have received a response from your provider.  Your safety is important to [place].  If you have drug allergies check your prescription carefully.    You can use MyChart to ask questions about today's visit, request a non-urgent call back, or ask for a work or school excuse for 24 hours related to this e-Visit. If it has been greater than 24 hours you will need to follow up with your provider, or enter a new e-Visit to address those concerns.  You will get an e-mail in the next two days asking about your experience.  I hope that your e-visit has been valuable and will speed your recovery. Thank you for using e-visits.   5 minutes spent on this chart

## 2020-10-25 ENCOUNTER — Ambulatory Visit
Admission: EM | Admit: 2020-10-25 | Discharge: 2020-10-25 | Disposition: A | Discharge status: Home / Self Care | Payer: BC Managed Care – PPO | Attending: Family Medicine | Admitting: Family Medicine

## 2020-10-25 ENCOUNTER — Ambulatory Visit (INDEPENDENT_AMBULATORY_CARE_PROVIDER_SITE_OTHER): Payer: BC Managed Care – PPO

## 2020-10-25 DIAGNOSIS — R5383 Other fatigue: Secondary | ICD-10-CM

## 2020-10-25 DIAGNOSIS — U071 COVID-19: Secondary | ICD-10-CM | POA: Diagnosis not present

## 2020-10-25 DIAGNOSIS — R0602 Shortness of breath: Secondary | ICD-10-CM

## 2020-10-25 DIAGNOSIS — J1282 Pneumonia due to coronavirus disease 2019: Secondary | ICD-10-CM

## 2020-10-25 DIAGNOSIS — R059 Cough, unspecified: Secondary | ICD-10-CM

## 2020-10-25 MED ORDER — AZITHROMYCIN 250 MG PO TABS: 250.0000 mg | ORAL_TABLET | Freq: Every day | ORAL | 0 refills | Status: AC

## 2020-10-25 MED ORDER — BENZONATATE 100 MG PO CAPS: 100.0000 mg | ORAL_CAPSULE | Freq: Three times a day (TID) | ORAL | 0 refills | Status: AC

## 2020-10-25 MED ORDER — PREDNISONE 10 MG (21) PO TBPK: ORAL_TABLET | Freq: Every day | ORAL | 0 refills | Status: AC

## 2020-10-25 MED ORDER — ALBUTEROL SULFATE HFA 108 (90 BASE) MCG/ACT IN AERS: 2.0000 | INHALATION_SPRAY | RESPIRATORY_TRACT | 0 refills | Status: AC | PRN

## 2020-10-25 NOTE — ED Provider Notes (Signed)
[facility]   [id number] 10/25/20 Arrival Time: 4166   CC: COVID symptoms  SUBJECTIVE: History from: patient.  DEZIRE TURK is a 54 y.o. female who presents with SOB, fatigue, cough. Was diagnosed with Covid 19 on 10/15/20. Symptoms are aggravated by activity. Denies sick exposure to COVID, flu or strep. Denies recent travel. Has not completed Covid vaccines. Has not taken OTC medications for this. Denies previous symptoms in the past. Denies fever, chills, sinus pain, rhinorrhea, sore throat, wheezing, chest pain, nausea, changes in bowel or bladder habits.    ROS: As per HPI.  All other pertinent ROS negative.     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   Current Outpatient Medications on File Prior to Encounter  Medication Sig Dispense Refill   amoxicillin (AMOXIL) 500 MG tablet Take 500 mg by mouth 3 (three) times daily.     aspirin EC 81 MG tablet Take 81 mg by mouth as needed.     aspirin-acetaminophen-caffeine (EXCEDRIN MIGRAINE) 250-250-65 MG tablet Take 2 tablets by mouth every 6 (six) hours as needed for headache.     Estradiol Acetate (FEMRING) 0.1 MG/24HR RING Place vaginally.     loratadine (CLARITIN) 10 MG tablet Take 10 mg by mouth daily.     ondansetron (ZOFRAN) 4 MG tablet Take 1 tablet (4 mg total) by mouth every 8 (eight) hours as needed for nausea or vomiting. 20  tablet 0   Prasterone (INTRAROSA) 6.5 MG INST Place 1 suppository vaginally daily. 30 each 12   ranitidine (ZANTAC) 150 MG tablet Take 150 mg by mouth 2 (two) times daily.     Social History   Socioeconomic History   Marital status: Married    Spouse name: Not on file   Number of children: Not on file   Years of education: Not on file   Highest education level: Not on file  Occupational History   Not on file  Tobacco Use   Smoking status: Former Smoker    Packs/day: 0.00    Years: 35.00    Pack years: 0.00    Quit date: 12/22/2013    Years since quitting: 6.8   Smokeless tobacco: Never Used   Tobacco comment: VAPE  Vaping Use   Vaping Use: Every day  Substance and Sexual Activity   Alcohol use: Not Currently    Comment: occassional   Drug use: No   Sexual activity: Not on file  Other Topics Concern   Not on file  Social History Narrative   Not on file   Social Determinants of Health   Financial Resource Strain:    Difficulty of Paying Living Expenses: Not on file  Food Insecurity:    Worried About [place] in the Last Year: Not on file   [place] in the Last Year:  Not on file  Transportation Needs:    Lack of Transportation (Medical): Not on file   Lack of Transportation (Non-Medical): Not on file  Physical Activity:    Days of Exercise per Week: Not on file   Minutes of Exercise per Session: Not on file  Stress:    Feeling of Stress : Not on file  Social Connections:    Frequency of Communication with Friends and Family: Not on file   Frequency of Social Gatherings with Friends and Family: Not on file   Attends Religious Services: Not on file   Active Member of Clubs or Organizations: Not on file   Attends Archivist Meetings: Not on file   Marital Status: Not on file  Intimate Partner Violence:    Fear of Current or Ex-Partner: Not on file   Emotionally Abused: Not on file   Physically Abused: Not  on file   Sexually Abused: Not on file   Family History  Problem Relation Age of Onset   Kidney disease Neg Hx    Bladder Cancer Neg Hx    Prostate cancer Neg Hx    Kidney cancer Neg Hx     OBJECTIVE:  Vitals:   10/25/20 1427 10/25/20 1428  BP: 117/66   Pulse: 78   Resp: 18   Temp: 98.1 F (36.7 C)   TempSrc: Oral   SpO2: 95%   Weight:  122 lb (55.3 kg)  Height:  5\' 3"  (1.6 m)     General appearance: alert; appears fatigued, but nontoxic; speaking in full sentences and tolerating own secretions HEENT: NCAT; Ears: EACs clear, TMs pearly gray; Eyes: PERRL.  EOM grossly intact. Sinuses: nontender; Nose: nares patent without rhinorrhea, Throat: oropharynx clear, tonsils non erythematous or enlarged, uvula midline  Neck: supple without LAD Lungs: unlabored respirations, symmetrical air entry; cough: absent; no respiratory distress; coarse lung sounds to bilateral lower lobes Heart: regular rate and rhythm.  Radial pulses 2+ symmetrical bilaterally Skin: warm and dry Psychological: alert and cooperative; normal mood and affect  LABS:  No results found for this or any previous visit (from the past 24 hour(s)).   ASSESSMENT & PLAN:  1. Pneumonia due to COVID-19 virus   2. COVID-19   3. Cough   4. SOB (shortness of breath)   5. Other fatigue     Meds ordered this encounter  Medications   predniSONE (STERAPRED UNI-PAK 21 TAB) 10 MG (21) TBPK tablet    Sig: Take by mouth daily. Take 6 tabs by mouth daily  for 2 days, then 5 tabs for 2 days, then 4 tabs for 2 days, then 3 tabs for 2 days, 2 tabs for 2 days, then 1 tab by mouth daily for 2 days    Dispense:  42 tablet    Refill:  0    Order Specific Question:   Supervising Provider    Answer:   Chase Picket [[id number]]   albuterol (VENTOLIN HFA) 108 (90 Base) MCG/ACT inhaler    Sig: Inhale 2 puffs into the lungs every 4 (four) hours as needed for wheezing or shortness of breath.    Dispense:  18 g    Refill:  0      Order Specific Question:   Supervising Provider    Answer:   Chase Picket [[id number]]   azithromycin (ZITHROMAX) 250 MG tablet    Sig: Take 1 tablet (250 mg total) by mouth daily. Take first 2 tablets together, then 1 every day  until finished.    Dispense:  6 tablet    Refill:  0    Order Specific Question:   Supervising Provider    Answer:   Chase Picket [[id number]]   benzonatate (TESSALON) 100 MG capsule    Sig: Take 1 capsule (100 mg total) by mouth every 8 (eight) hours.    Dispense:  21 capsule    Refill:  0    Order Specific Question:   Supervising Provider    Answer:   Chase Picket [[id number]]   Xray today shows Covid pneumonia Prescribed azithromycin Prescribed steroid taper Prescribed albuterol inhaler Prescribed tessalon perles Recommend pulse oximeter as well Go to the ER with O2 sat below 90   COVID testing ordered.  It will take between 1-2 days for test results.  Someone will contact you regarding abnormal results.    Patient should remain in quarantine until they have received Covid results.  If negative you may resume normal activities (go back to work/school) while practicing hand hygiene, social distance, and mask wearing.  If positive, patient should remain in quarantine for 10 days from symptom onset AND greater than 72 hours after symptoms resolution (absence of fever without the use of fever-reducing medication and improvement in respiratory symptoms), whichever is longer Get plenty of rest and push fluids Use OTC zyrtec for nasal congestion, runny nose, and/or sore throat Use OTC flonase for nasal congestion and runny nose Use medications daily for symptom relief Use OTC medications like ibuprofen or tylenol as needed fever or pain Call or go to the ED if you have any new or worsening symptoms such as fever, worsening cough, shortness of breath, chest tightness, chest pain, turning blue, changes in mental status.  Reviewed expectations re: course  of current medical issues. Questions answered. Outlined signs and symptoms indicating need for more acute intervention. Patient verbalized understanding. After Visit Summary given.         Faustino Congress, NP 10/25/20 1530

## 2020-10-25 NOTE — Discharge Instructions (Addendum)
You have received an albuterol inhaler in the office today as well. You may use 2 puffs every 4-6 hours as needed for shortness of breath, wheezing, cough.  I have sent in a steroid taper for you to take as well. Take 6 tablets for the first two days, take 5 tablets for day three and four, take 4 tablets for days five and six, take 3 tablets for days seven and eight, take 2 tablets for day nine and ten, then take 1 tablet for days eleven and twelve.  I have sent in [place] for cough  I have sent in azithromycin. Take 2 tablets today. Take one tablet daily for the next 4 days.  This will take some time to recover from. I recommend a follow up xray in 4 weeks  I recommend getting a pulse oximeter to keep an eye on your heart rate and oxygen saturation. If your oxygen gets below 90%, go directly to the ER for further evaluation and treatment

## 2020-10-25 NOTE — ED Triage Notes (Signed)
Pt reports being dx with covid on 10/25. sts she is feeling short of breath with exertion. "My daughter told me I needed to be seen, she listened to my lungs last night and heard crackles".    Also reports having a rash on R lower back.

## 2020-10-27 LAB — SARS-COV-2, NAA 2 DAY TAT

## 2020-10-27 LAB — NOVEL CORONAVIRUS, NAA: SARS-CoV-2, NAA: DETECTED — AB

## 2021-04-04 ENCOUNTER — Other Ambulatory Visit: Payer: Self-pay | Admitting: Family Medicine

## 2021-04-05 LAB — CMP12+LP+TP+TSH+6AC+CBC/D/PLT
ALT: 12 IU/L (ref 0–32)
AST: 20 IU/L (ref 0–40)
Albumin/Globulin Ratio: 2 (ref 1.2–2.2)
Albumin: 4.5 g/dL (ref 3.8–4.9)
Alkaline Phosphatase: 49 IU/L (ref 44–121)
BUN/Creatinine Ratio: 17 (ref 9–23)
BUN: 12 mg/dL (ref 6–24)
Basophils Absolute: 0 10*3/uL (ref 0.0–0.2)
Basos: 1 %
Bilirubin Total: <0.2 mg/dL (ref 0.0–1.2)
Calcium: 9.3 mg/dL (ref 8.7–10.2)
Chloride: 103 mmol/L (ref 96–106)
Chol/HDL Ratio: 3.1 ratio (ref 0.0–4.4)
Cholesterol, Total: 228 mg/dL — ABNORMAL HIGH (ref 100–199)
Creatinine, Ser: 0.71 mg/dL (ref 0.57–1.00)
EOS (ABSOLUTE): 0.2 10*3/uL (ref 0.0–0.4)
Eos: 6 %
Estimated CHD Risk: <0.5 times avg. (ref 0.0–1.0)
Free Thyroxine Index: 1.9 (ref 1.2–4.9)
GGT: 5 IU/L (ref 0–60)
Globulin, Total: 2.2 g/dL (ref 1.5–4.5)
Glucose: 98 mg/dL (ref 65–99)
HDL: 73 mg/dL (ref >39)
Hematocrit: 35.4 % (ref 34.0–46.6)
Hemoglobin: 11.4 g/dL (ref 11.1–15.9)
Immature Grans (Abs): 0 10*3/uL (ref 0.0–0.1)
Immature Granulocytes: 0 %
Iron: 39 ug/dL (ref 27–159)
LDH: 149 IU/L (ref 119–226)
LDL Chol Calc (NIH): 146 mg/dL — ABNORMAL HIGH (ref 0–99)
Lymphocytes Absolute: 1 10*3/uL (ref 0.7–3.1)
Lymphs: 31 %
MCH: 29 pg (ref 26.6–33.0)
MCHC: 32.2 g/dL (ref 31.5–35.7)
MCV: 90 fL (ref 79–97)
Monocytes Absolute: 0.3 10*3/uL (ref 0.1–0.9)
Monocytes: 9 %
Neutrophils Absolute: 1.7 10*3/uL (ref 1.4–7.0)
Neutrophils: 53 %
Phosphorus: 3 mg/dL (ref 3.0–4.3)
Platelets: 222 10*3/uL (ref 150–450)
Potassium: 4.7 mmol/L (ref 3.5–5.2)
RBC: 3.93 x10E6/uL (ref 3.77–5.28)
RDW: 13.3 % (ref 11.7–15.4)
Sodium: 140 mmol/L (ref 134–144)
T3 Uptake Ratio: 24 % (ref 24–39)
T4, Total: 8.1 ug/dL (ref 4.5–12.0)
TSH: 5.06 u[IU]/mL — ABNORMAL HIGH (ref 0.450–4.500)
Total Protein: 6.7 g/dL (ref 6.0–8.5)
Triglycerides: 52 mg/dL (ref 0–149)
Uric Acid: 3.6 mg/dL (ref 3.0–7.2)
VLDL Cholesterol Cal: 9 mg/dL (ref 5–40)
WBC: 3.1 10*3/uL — ABNORMAL LOW (ref 3.4–10.8)
eGFR: 100 mL/min/{1.73_m2} (ref >59)

## 2022-02-19 ENCOUNTER — Ambulatory Visit
Admission: EM | Admit: 2022-02-19 | Discharge: 2022-02-19 | Disposition: A | Discharge status: Home / Self Care | Payer: BC Managed Care – PPO | Attending: Physician Assistant | Admitting: Physician Assistant

## 2022-02-19 ENCOUNTER — Ambulatory Visit (INDEPENDENT_AMBULATORY_CARE_PROVIDER_SITE_OTHER): Payer: Self-pay

## 2022-02-19 ENCOUNTER — Encounter: Payer: Self-pay | Admitting: Emergency Medicine

## 2022-02-19 DIAGNOSIS — R079 Chest pain, unspecified: Secondary | ICD-10-CM

## 2022-02-19 DIAGNOSIS — R0789 Other chest pain: Secondary | ICD-10-CM

## 2022-02-19 DIAGNOSIS — R091 Pleurisy: Secondary | ICD-10-CM

## 2022-02-19 MED ORDER — NAPROXEN 375 MG PO TABS: 375.0000 mg | ORAL_TABLET | Freq: Two times a day (BID) | ORAL | 0 refills | Status: AC

## 2022-02-19 NOTE — ED Provider Notes (Signed)
Melanie Sparks    CSN: [id number] Arrival date & time: 02/19/22  0815      History   Chief Complaint Chief Complaint  Patient presents with   Chest Pain    HPI Melanie Sparks is a 56 y.o. female.   Patient presents today with a 1 week history of intermittent chest pain.  She reports that pain is fairly constant and is not associated with activity.  It is worse with deep breathing.  Pain is rated 6 on a 0-10 pain scale, localized to left anterior chest with radiation into neck/ear, described as aching, no alleviating factors identified.  She initially thought symptoms were related to an ear infection and was prescribed an antibiotic which has not provided any relief of symptoms.  She has not tried over-the-counter medication for symptom management.  She denies associated shortness of breath, lightheadedness, nausea/vomiting, weakness.  She denies history of cardiovascular disease, diabetes, high blood pressure, hyperlipidemia.  She denies any family history of cardiovascular disease.  She denies any history of VTE event, recent hospitalization, recent immobilization, recent COVID-19 diagnosis, history of malignancy.  She was previously prescribed vaginal estrogen but has stopped using this within the past few weeks due to cost of medication.  She denies any recent illness or additional symptoms including fever, cough, nasal congestion.   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      Home  Medications    Prior to Admission medications   Medication Sig Start Date End Date Taking? Authorizing Provider  naproxen (NAPROSYN) 375 MG tablet Take 1 tablet (375 mg total) by mouth 2 (two) times daily. 02/19/22  Yes Nyomie Ehrlich K, PA-C  albuterol (VENTOLIN HFA) 108 (90 Base) MCG/ACT inhaler Inhale 2 puffs into the lungs every 4 (four) hours as needed for wheezing or shortness of breath. 10/25/20   Faustino Congress, NP  amoxicillin (AMOXIL) 500 MG tablet Take 500 mg by mouth 3 (three) times daily.    [provider]  aspirin EC 81 MG tablet Take 81 mg by mouth as needed.    [provider]  aspirin-acetaminophen-caffeine (EXCEDRIN MIGRAINE) [phone] MG tablet Take 2 tablets by mouth every 6 (six) hours as needed for headache.    [provider]  azithromycin (ZITHROMAX) 250 MG tablet Take 1 tablet (250 mg total) by mouth daily. Take first 2 tablets together, then 1 every day until finished. 10/25/20   Faustino Congress, NP  benzonatate (TESSALON) 100 MG capsule Take 1 capsule (100 mg total) by mouth every 8 (eight) hours. 10/25/20   Faustino Congress, NP  Estradiol Acetate [facility]) 0.1 MG/24HR RING Place vaginally.    [provider]  loratadine (CLARITIN) 10 MG tablet Take 10 mg by mouth daily.    [provider]  ondansetron (ZOFRAN) 4 MG tablet Take 1 tablet (4 mg total) by mouth every 8 (eight) hours as needed  for nausea or vomiting. 11/14/19   Alveria Apley, PA-C  Prasterone (INTRAROSA) 6.5 MG INST Place 1 suppository vaginally daily. 03/12/17   Zara Council A, PA-C  predniSONE (STERAPRED UNI-PAK 21 TAB) 10 MG (21) TBPK tablet Take by mouth daily. Take 6 tabs by mouth daily  for 2 days, then 5 tabs for 2 days, then 4 tabs for 2 days, then 3 tabs for 2 days, 2 tabs for 2 days, then 1 tab by mouth daily for 2 days 10/25/20   Faustino Congress, NP  ranitidine (ZANTAC) 150 MG tablet Take 150 mg by mouth 2 (two) times daily.    [provider]    Family History Family History  Problem Relation Age of Onset   Kidney disease Neg Hx    Bladder Cancer Neg Hx    Prostate cancer Neg Hx    Kidney cancer Neg Hx     Social History Social History   Tobacco Use   Smoking status: Former    Packs/day: 0.00    Years: 35.00    Pack years: 0.00    Types: Cigarettes    Quit date: 12/22/2013    Years since quitting: 8.1   Smokeless tobacco: Never   Tobacco comments:    VAPE  Vaping Use   Vaping Use: Every day  Substance Use Topics   Alcohol use: Not Currently    Comment: occassional   Drug use: No     Allergies   Macrobid [nitrofurantoin], Bupropion, Codeine, Hydrocodone-acetaminophen, Macrobid [nitrofurantoin monohyd macro], and Oxycodone   Review of Systems Review of Systems  Constitutional:  Positive for activity change. Negative for appetite change, fatigue and fever.  HENT:  Positive for ear pain. Negative for congestion, sinus pressure, sneezing and sore throat.   Respiratory:  Negative for cough and shortness of breath.   Cardiovascular:  Positive for chest pain. Negative for palpitations and leg swelling.  Gastrointestinal:  Negative for abdominal pain, diarrhea, nausea and vomiting.  Neurological:  Negative for dizziness, light-headedness and headaches.    Physical Exam Triage Vital Signs ED Triage Vitals  Enc Vitals Group     BP 02/19/22 0927 103/71     Pulse Rate 02/19/22 0927 94     Resp 02/19/22 0927 18     Temp 02/19/22 0927 97.9 F (36.6 C)     Temp src --      SpO2 02/19/22 0927 98 %     Weight --      Height --      Head Circumference --      Peak Flow --      Pain Score 02/19/22 0929 7     Pain Loc --      Pain Edu? --      Excl. in [place]? --    No data found.  Updated Vital Signs BP 103/71    Pulse 94    Temp 97.9 F (36.6 C)    Resp 18    SpO2 98%   Visual Acuity Right Eye Distance:   Left Eye Distance:   Bilateral Distance:    Right Eye Near:   Left Eye Near:     Bilateral Near:     Physical Exam Vitals reviewed.  Constitutional:      General: She is awake. She is not in acute distress.    Appearance: Normal appearance. She is well-developed. She is not ill-appearing.     Comments: Very pleasant female appears stated age in no acute distress  sitting comfortably on exam room table  HENT:     Head: Normocephalic and atraumatic.     Right Ear: Tympanic membrane, ear canal and external ear normal. Tympanic membrane is not erythematous or bulging.     Left Ear: Tympanic membrane, ear canal and external ear normal. Tympanic membrane is not erythematous or bulging.     Nose:     Right Sinus: No maxillary sinus tenderness or frontal sinus tenderness.     Left Sinus: No maxillary sinus tenderness or frontal sinus tenderness.     Mouth/Throat:     Pharynx: Uvula midline. No oropharyngeal exudate or posterior oropharyngeal erythema.     Comments: Drainage present posterior oropharynx Cardiovascular:     Rate and Rhythm: Normal rate and regular rhythm.     Heart sounds: Normal heart sounds, S1 normal and S2 normal. No murmur heard. Pulmonary:     Effort: Pulmonary effort is normal.     Breath sounds: Normal breath sounds. No wheezing, rhonchi or rales.     Comments: Clear to auscultation bilaterally Chest:     Chest wall: Tenderness present. No deformity or swelling.     Comments: Pain is reproducible on exam with tenderness to palpation over left anterior chest wall. Musculoskeletal:     Right lower leg: No edema.     Left lower leg: No edema.  Psychiatric:        Behavior: Behavior is cooperative.     UC Treatments / Results  Labs (all labs ordered are listed, but only abnormal results are displayed) Labs Reviewed - No data to display  EKG   Radiology DG Chest 2 View  Result Date: 02/19/2022 CLINICAL DATA:  Left-sided pleuritic chest pain. EXAM: CHEST - 2 VIEW COMPARISON:  Chest x-ray dated October 25, 2020. FINDINGS: The heart size and  mediastinal contours are within normal limits. Normal pulmonary vascularity. The lungs remain hyperinflated with coarse interstitial markings. No focal consolidation, pleural effusion, or pneumothorax. No acute osseous abnormality. IMPRESSION: 1. No acute cardiopulmonary disease. 2. COPD. Electronically Signed   By: Titus Dubin M.D.   On: 02/19/2022 09:59    Procedures Procedures (including critical care time)  Medications Ordered in UC Medications - No data to display  Initial Impression / Assessment and Plan / UC Course  I have reviewed the triage vital signs and the nursing notes.  Pertinent labs & imaging results that were available during my care of the patient were reviewed by me and considered in my medical decision making (see chart for details).     Patient is well-appearing, nontoxic, afebrile, nontachycardic, oxygenating at 98%.  Chest x-ray was obtained that showed COPD changes without acute findings.  EKG obtained showed normal sinus rhythm with ventricular rate of 73 bpm with nonspecific ST changes in V2 compared to 05/13/2017 tracing but no ischemic changes.  Discussed likely musculoskeletal etiology given pain is reproducible on exam.  Patient was started on Naprosyn twice daily.  Instructed not to take additional NSAIDs with this medication due to risk of GI bleeding.  Recommended she follow-up with primary care soon as possible for further evaluation and management.  Discussed that if she has any worsening symptoms including severe pain, nausea/vomiting interfere with oral intake, shortness of breath, weakness she needs to go to the emergency room immediately.  All questions answered to patient satisfaction.  Final Clinical Impressions(s) / UC Diagnoses   Final diagnoses:  Chest pain, unspecified type  Chest wall pain     Discharge Instructions  I believe that your symptoms are musculoskeletal in nature since we were able to push on your chest and cause the pain  to happen.  Your EKG and chest x-ray did not show any concerning findings.  Take Naprosyn twice daily.  Do not take NSAIDs with this medication due to risk of GI bleeding including aspirin, ibuprofen/Advil, naproxen/Aleve.  You can use heat for additional symptom relief.  Follow-up with your PCP soon as possible.  If you develop any worsening symptoms including severe pain, shortness of breath, nausea/vomiting interfering with oral intake, lightheadedness, weakness you need to go to the emergency room immediately.     ED Prescriptions     Medication Sig Dispense Auth. Provider   naproxen (NAPROSYN) 375 MG tablet Take 1 tablet (375 mg total) by mouth 2 (two) times daily. 20 tablet Fermon Ureta, Derry Skill, PA-C      PDMP not reviewed this encounter.   Terrilee Croak, PA-C 02/19/22 1101

## 2022-02-19 NOTE — Discharge Instructions (Addendum)
I believe that your symptoms are musculoskeletal in nature since we were able to push on your chest and cause the pain to happen.  Your EKG and chest x-ray did not show any concerning findings.  Take Naprosyn twice daily.  Do not take NSAIDs with this medication due to risk of GI bleeding including aspirin, ibuprofen/Advil, naproxen/Aleve.  You can use heat for additional symptom relief.  Follow-up with your PCP soon as possible.  If you develop any worsening symptoms including severe pain, shortness of breath, nausea/vomiting interfering with oral intake, lightheadedness, weakness you need to go to the emergency room immediately. ?

## 2022-02-19 NOTE — ED Triage Notes (Signed)
Pt presents with pain on the left side of her chest that hurts when she takes a deep breath in. The pain radiates up to her left ear x 1 week. ?

## 2023-02-10 ENCOUNTER — Other Ambulatory Visit: Payer: Self-pay | Admitting: Obstetrics and Gynecology

## 2023-02-10 DIAGNOSIS — Z1231 Encounter for screening mammogram for malignant neoplasm of breast: Secondary | ICD-10-CM

## 2023-11-26 IMAGING — DX DG CHEST 2V
2 series · 2 of 2 positions shown · non-contrast
Comparison: Chest x-ray dated October 25, 2020.

CLINICAL DATA: Left-sided pleuritic chest pain.

EXAM:
CHEST - 2 VIEW

[chest pa]
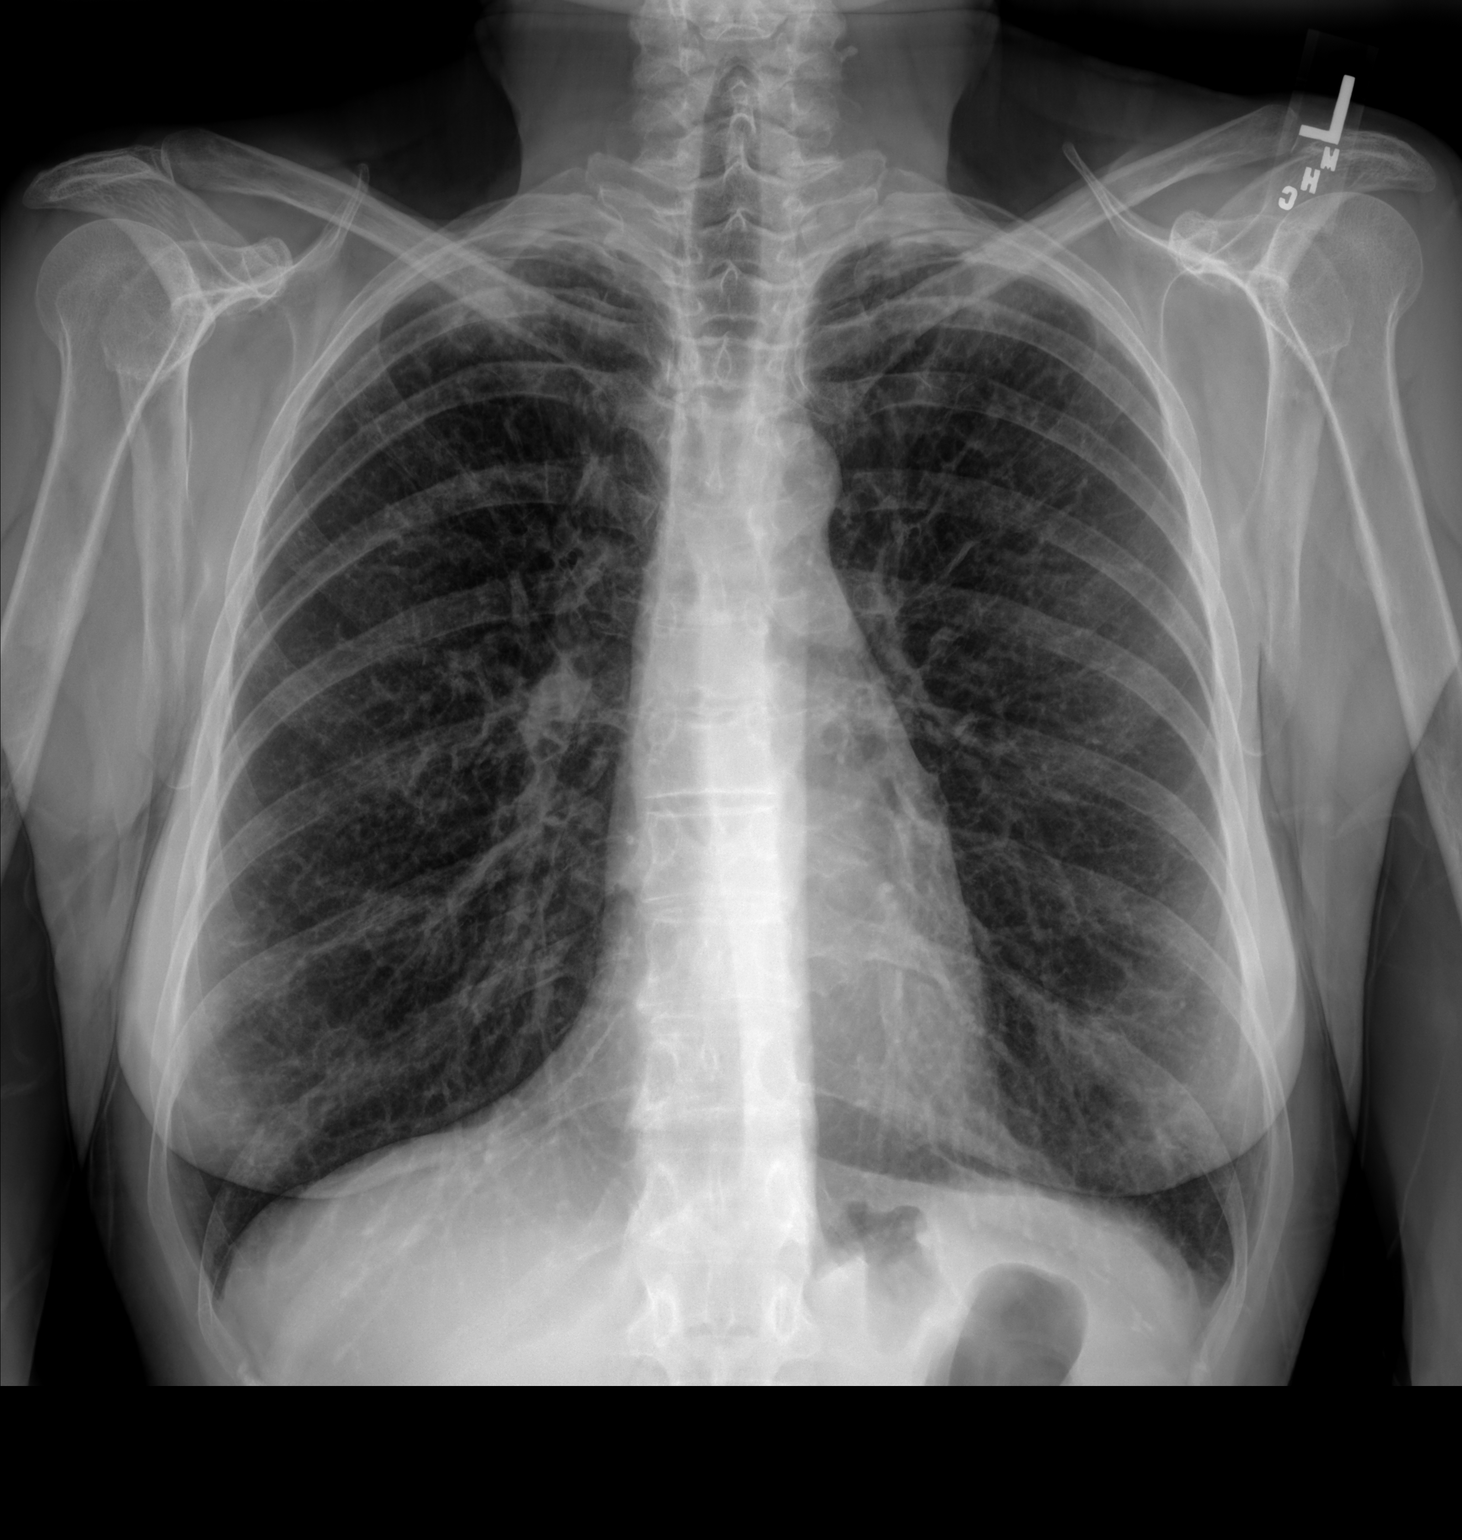

[chest lat]
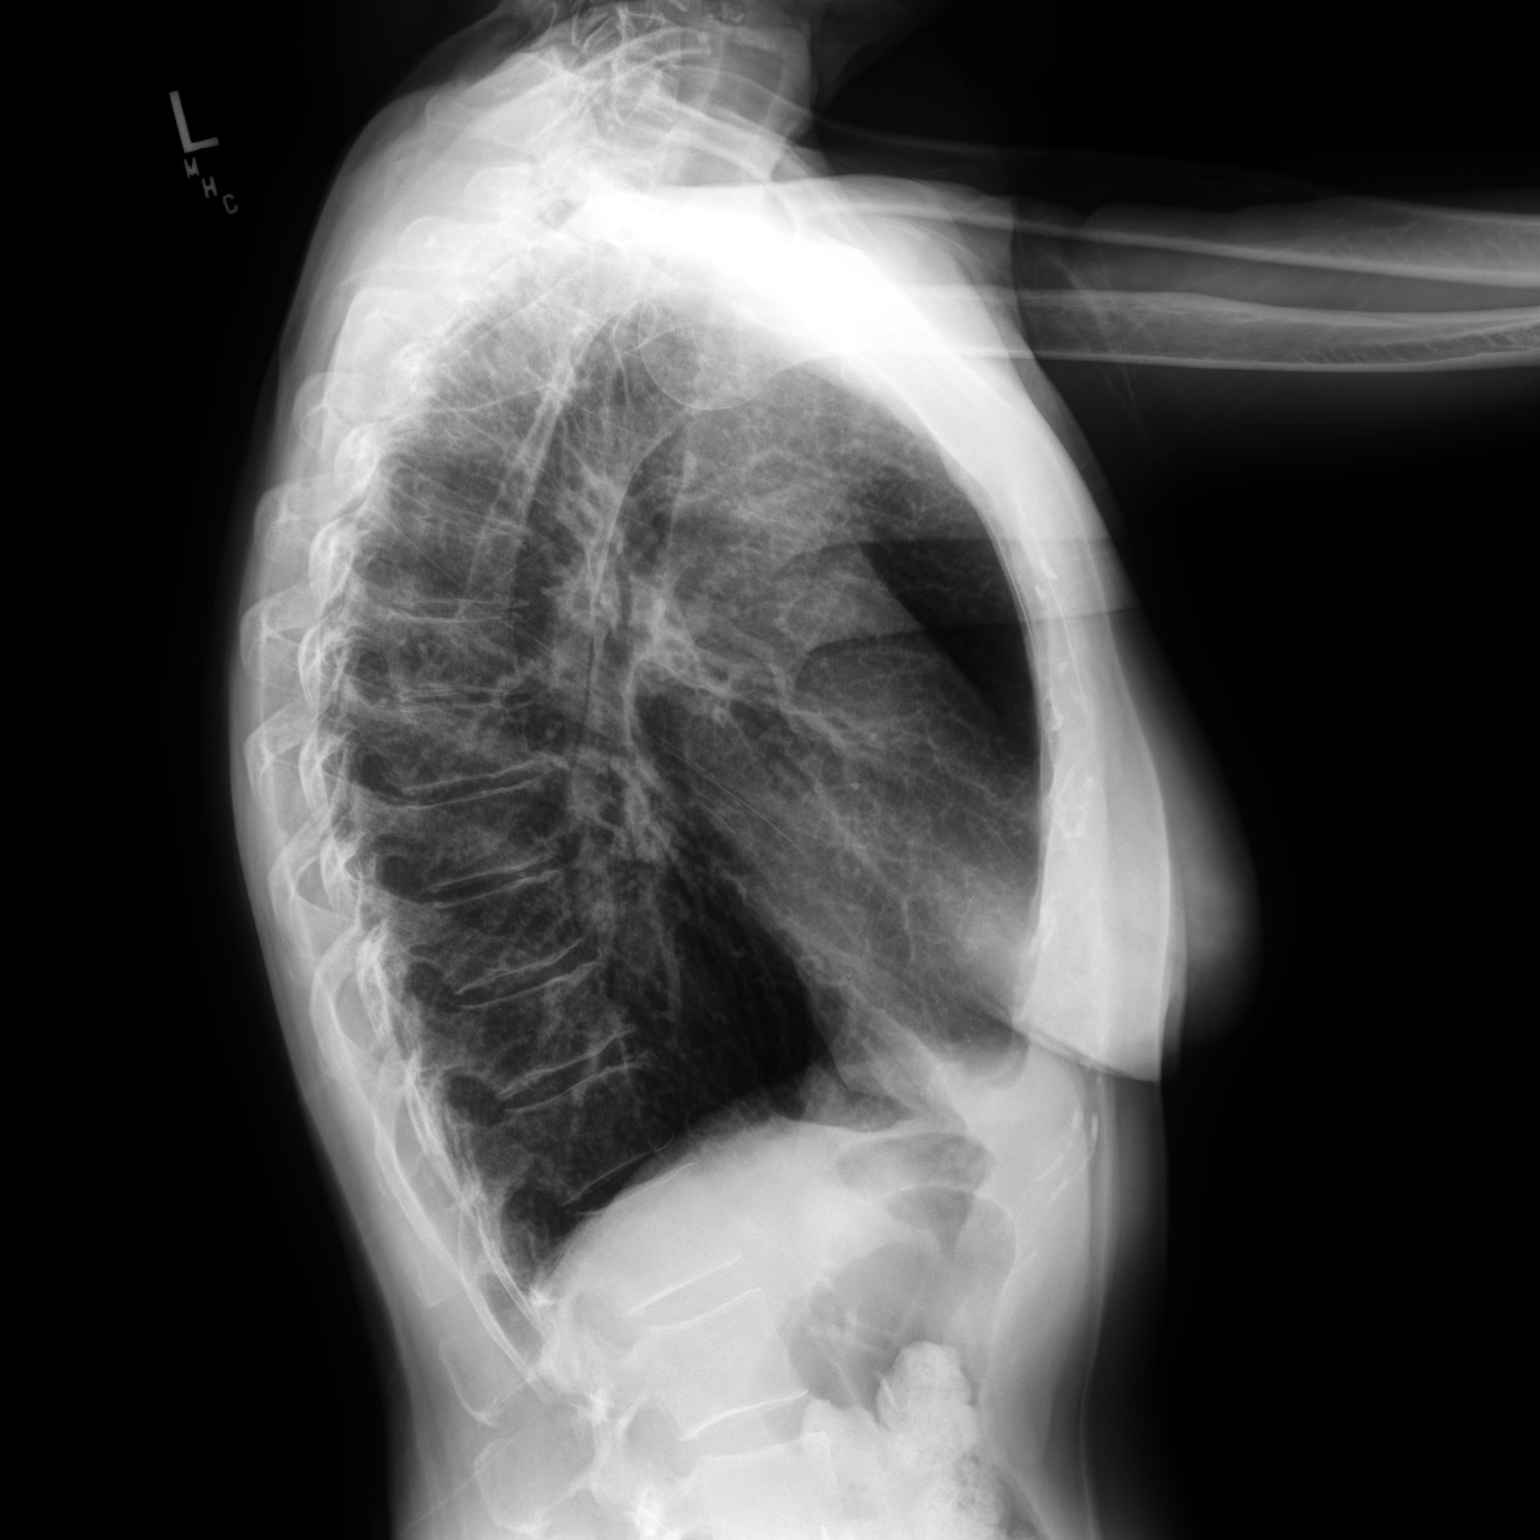

[2 of 2 positions shown; findings below may reference images not displayed]

FINDINGS: The heart size and mediastinal contours are within normal limits.
Normal pulmonary vascularity. The lungs remain hyperinflated with
coarse interstitial markings. No focal consolidation, pleural
effusion, or pneumothorax. No acute osseous abnormality.
IMPRESSION: 1. No acute cardiopulmonary disease.
2. COPD.
